# Patient Record
Sex: Female | Born: 1960 | Race: White | Hispanic: No | Smoking: Never smoker
Health system: Southern US, Community
[De-identification: ages and names within clinical notes are randomized; demographics above are authoritative.]

## PROBLEM LIST (undated history)

## (undated) DIAGNOSIS — I10 Essential (primary) hypertension: Secondary | ICD-10-CM

## (undated) DIAGNOSIS — I341 Nonrheumatic mitral (valve) prolapse: Secondary | ICD-10-CM

## (undated) DIAGNOSIS — I480 Paroxysmal atrial fibrillation: Secondary | ICD-10-CM

## (undated) DIAGNOSIS — F419 Anxiety disorder, unspecified: Secondary | ICD-10-CM

## (undated) HISTORY — PX: CHOLECYSTECTOMY: SHX55

## (undated) HISTORY — PX: COLONOSCOPY: SHX174

---

## 2005-03-25 ENCOUNTER — Ambulatory Visit: Payer: Self-pay

## 2011-09-22 ENCOUNTER — Ambulatory Visit: Payer: Self-pay | Admitting: Gastroenterology

## 2013-05-21 ENCOUNTER — Emergency Department: Payer: Self-pay | Admitting: Emergency Medicine

## 2014-12-21 ENCOUNTER — Encounter: Payer: Self-pay | Admitting: Gynecology

## 2014-12-21 ENCOUNTER — Ambulatory Visit
Admission: EM | Admit: 2014-12-21 | Discharge: 2014-12-21 | Disposition: A | Discharge status: Home / Self Care | Payer: 59 | Attending: Family Medicine | Admitting: Family Medicine

## 2014-12-21 DIAGNOSIS — N390 Urinary tract infection, site not specified: Secondary | ICD-10-CM | POA: Insufficient documentation

## 2014-12-21 DIAGNOSIS — I341 Nonrheumatic mitral (valve) prolapse: Secondary | ICD-10-CM | POA: Insufficient documentation

## 2014-12-21 DIAGNOSIS — F419 Anxiety disorder, unspecified: Secondary | ICD-10-CM | POA: Diagnosis not present

## 2014-12-21 DIAGNOSIS — R319 Hematuria, unspecified: Secondary | ICD-10-CM | POA: Diagnosis present

## 2014-12-21 DIAGNOSIS — I10 Essential (primary) hypertension: Secondary | ICD-10-CM | POA: Diagnosis not present

## 2014-12-21 DIAGNOSIS — I480 Paroxysmal atrial fibrillation: Secondary | ICD-10-CM | POA: Insufficient documentation

## 2014-12-21 HISTORY — DX: Anxiety disorder, unspecified: F41.9

## 2014-12-21 HISTORY — DX: Paroxysmal atrial fibrillation: I48.0

## 2014-12-21 HISTORY — DX: Essential (primary) hypertension: I10

## 2014-12-21 HISTORY — DX: Nonrheumatic mitral (valve) prolapse: I34.1

## 2014-12-21 LAB — URINALYSIS COMPLETE WITH MICROSCOPIC (ARMC ONLY)
Glucose, UA: NEGATIVE mg/dL
Nitrite: POSITIVE — AB
Specific Gravity, Urine: 1.025 (ref 1.005–1.030)
Squamous Epithelial / LPF: NONE SEEN — AB
pH: 5.5 (ref 5.0–8.0)

## 2014-12-21 MED ORDER — PHENAZOPYRIDINE HCL 200 MG PO TABS: 200.0000 mg | ORAL_TABLET | Freq: Three times a day (TID) | ORAL | Status: DC

## 2014-12-21 MED ORDER — CEFTRIAXONE SODIUM 1 G IJ SOLR
1.0000 g | Freq: Once | INTRAMUSCULAR | Status: AC
  Administered 2014-12-21: 1 g via INTRAMUSCULAR

## 2014-12-21 MED ORDER — SULFAMETHOXAZOLE-TRIMETHOPRIM 800-160 MG PO TABS: 1.0000 | ORAL_TABLET | Freq: Two times a day (BID) | ORAL | Status: DC

## 2014-12-21 NOTE — ED Provider Notes (Signed)
CSN: [id number]     Arrival date & time 12/21/14  1738 History   None    Chief Complaint  Patient presents with  . Urinary Tract Infection  . Hematuria   (Consider location/radiation/quality/duration/timing/severity/associated sxs/prior Treatment) HPI  A 54 year old female who presents with urinary frequency dysuria hematuria and lower abdominal pain noted today. She states yesterday she felt fine as well as this morning but later this afternoon began to have the urinary symptoms. She has not had fever has felt slightly chilled today. Her last UTI was about 20 years ago. She is going through menopause separated from her husband 2 years ago and is not sexually active at the present time. He has no vaginal discharge. She was frightened to see the blood along with some clots in the toilet. Past Medical History  Diagnosis Date  . Hypertension   . MVP (mitral valve prolapse)   . Anxiety   . PAF (paroxysmal atrial fibrillation)    Past Surgical History  Procedure Laterality Date  . Cholecystectomy     No family history on file. History  Substance Use Topics  . Smoking status: Never Smoker   . Smokeless tobacco: Not on file  . Alcohol Use: Yes   OB History    No data available     Review of Systems  Constitutional: Positive for chills. Negative for fever.  Genitourinary: Positive for dysuria, urgency, frequency and hematuria.  All other systems reviewed and are negative.   Allergies  Review of patient's allergies indicates no known allergies.  Home Medications   Prior to Admission medications   Medication Sig Start Date End Date Taking? Authorizing Provider  ALPRAZolam Prudy Feeler(XANAX) 0.5 MG tablet Take 0.5 mg by mouth at bedtime as needed for anxiety.   Yes Historical Provider, MD  B Complex-C (B-COMPLEX WITH VITAMIN C) tablet Take 1 tablet by mouth daily.   Yes Historical Provider, MD  benazepril-hydrochlorthiazide (LOTENSIN HCT) 20-12.5 MG per tablet Take 1 tablet by mouth daily.    Yes Historical Provider, MD  cholecalciferol (VITAMIN D) 1000 UNITS tablet Take 1,000 Units by mouth daily.   Yes Historical Provider, MD  cyclobenzaprine (FLEXERIL) 10 MG tablet Take 10 mg by mouth 3 (three) times daily as needed for muscle spasms.   Yes Historical Provider, MD  magnesium oxide (MAG-OX) 400 MG tablet Take 400 mg by mouth daily.   Yes Historical Provider, MD  metoprolol (LOPRESSOR) 50 MG tablet Take 50 mg by mouth 2 (two) times daily.   Yes Historical Provider, MD  nabumetone (RELAFEN) 750 MG tablet Take 750 mg by mouth daily.   Yes Historical Provider, MD  sertraline (ZOLOFT) 100 MG tablet Take 100 mg by mouth daily.   Yes Historical Provider, MD  traZODone (DESYREL) 50 MG tablet Take 50 mg by mouth at bedtime.   Yes Historical Provider, MD  phenazopyridine (PYRIDIUM) 200 MG tablet Take 1 tablet (200 mg total) by mouth 3 (three) times daily. 12/21/14   Lutricia FeilWilliam P Gisel Vipond, PA-C  sulfamethoxazole-trimethoprim (BACTRIM DS,SEPTRA DS) 800-160 MG per tablet Take 1 tablet by mouth 2 (two) times daily. 12/21/14   Chrissie NoaWilliam P Ixel Boehning, PA-C   BP 122/82 mmHg  Pulse 80  Temp(Src) 99 F (37.2 C) (Tympanic)  Ht 5\' 5"  (1.651 m)  Wt 214 lb (97.07 kg)  BMI 35.61 kg/m2  SpO2 100% Physical Exam  Constitutional: She is oriented to person, place, and time. She appears well-developed and well-nourished.  HENT:  Head: Normocephalic and atraumatic.  Eyes: EOM are  normal. Pupils are equal, round, and reactive to light.  Pulmonary/Chest: Effort normal and breath sounds normal. No respiratory distress. She has no wheezes. She has no rales. She exhibits no tenderness.  Abdominal: Soft. Bowel sounds are normal. She exhibits no distension and no mass. There is no tenderness. There is no rebound and no guarding.  No CVA tenderness is present  Neurological: She is alert and oriented to person, place, and time.  Skin: Skin is warm and dry.  Psychiatric: She has a normal mood and affect. Her behavior is  normal. Judgment and thought content normal.  Nursing note and vitals reviewed.   ED Course  Procedures (including critical care time) Labs Review Labs Reviewed  URINALYSIS COMPLETEWITH MICROSCOPIC ([facility] ONLY) - Abnormal; Notable for the following:    Color, Urine RED (*)    APPearance TURBID (*)    Bilirubin Urine 2+ (*)    Ketones, ur TRACE (*)    Hgb urine dipstick 3+ (*)    Protein, ur >300 (*)    Nitrite POSITIVE (*)    Leukocytes, UA 2+ (*)    Bacteria, UA FEW (*)    Squamous Epithelial / LPF NONE SEEN (*)    All other components within normal limits  URINE CULTURE   Medications  cefTRIAXone (ROCEPHIN) injection 1 g (1 g Intramuscular Given 12/21/14 1859)   Medications  cefTRIAXone (ROCEPHIN) injection 1 g (1 g Intramuscular Given 12/21/14 1859)   Imaging Review No results found.   MDM   1. UTI (urinary tract infection), uncomplicated    Discharge Medication List as of 12/21/2014  6:48 PM    START taking these medications   Details  phenazopyridine (PYRIDIUM) 200 MG tablet Take 1 tablet (200 mg total) by mouth 3 (three) times daily., Starting 12/21/2014, Until Discontinued, Print    sulfamethoxazole-trimethoprim (BACTRIM DS,SEPTRA DS) 800-160 MG per tablet Take 1 tablet by mouth 2 (two) times daily., Starting 12/21/2014, Until Discontinued, Print       Plan: 1. Test/x-ray results and diagnosis reviewed with patient 2. rx as per orders; risks, benefits, potential side effects reviewed with patient 3. Recommend supportive treatment with fluids,rest 4. F/u prn if symptoms worsen or don't improve Go to ED  For fever or back pain.    Lutricia Feil, PA-C 12/21/14 2019  Lutricia Feil, PA-C 12/21/14 2020

## 2014-12-21 NOTE — ED Notes (Signed)
Patient c/o frequent urination / hematuria / lower back pain and painful urination x today.

## 2014-12-21 NOTE — Discharge Instructions (Signed)

## 2014-12-23 LAB — URINE CULTURE
Culture: >=100000
Special Requests: NORMAL

## 2014-12-28 ENCOUNTER — Telehealth: Payer: Self-pay | Admitting: Emergency Medicine

## 2014-12-28 NOTE — ED Notes (Signed)
Patient was notified that her urine culture grew bacteria and the bacteria is sensitive to Bactrim.   Patient instructed to finish her antibiotic. Patient instructed to follow-up here or with her PCP if symptoms worsen.  Patient verbalized understanding.

## 2015-12-03 ENCOUNTER — Telehealth: Payer: Self-pay

## 2015-12-03 NOTE — Telephone Encounter (Signed)
Patient needs consult appt with Dr. Sherryl MangesSteven Klein for worsening afib  Per Dr.  Bethann PunchesMark Miller [facility].

## 2015-12-06 ENCOUNTER — Encounter: Payer: Self-pay | Admitting: Internal Medicine

## 2015-12-06 ENCOUNTER — Ambulatory Visit (INDEPENDENT_AMBULATORY_CARE_PROVIDER_SITE_OTHER): Payer: 59 | Admitting: Internal Medicine

## 2015-12-06 VITALS — BP 130/86 | HR 73 | Ht 65.0 in | Wt 223.8 lb

## 2015-12-06 DIAGNOSIS — I4891 Unspecified atrial fibrillation: Secondary | ICD-10-CM | POA: Diagnosis not present

## 2015-12-06 MED ORDER — ATENOLOL 50 MG PO TABS: ORAL_TABLET | ORAL | Status: DC

## 2015-12-06 MED ORDER — METOPROLOL TARTRATE 50 MG PO TABS: ORAL_TABLET | ORAL | Status: DC

## 2015-12-06 NOTE — Progress Notes (Signed)
ELECTROPHYSIOLOGY CONSULT NOTE  Patient ID: Amber Beasley, MRN: [id number], DOB/AGE: 07-21-1960 55 y.o. Admit date: (Not on file) Date of Consult: 12/06/2015  Primary Physician: Danella PentonMark F Miller, MD Primary Cardiologist: new  Consulting Physician Hyacinth MeekerMiller  Chief Complaint: atrial fib   HPI Amber Beasley is a 55 y.o. female  Referred for consideration of treatment options for atrial fibrillation.  She long-standing history of palpitations. She describes as irregular and associated with rapid rates. They're accompanied by headaches and chest discomfort and dyspnea on exertion. They apparently have never been recorded as atrial fibrillation.  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 Over the last year or 2 she has gained about 40 pounds in the wake of her husband having left her. This is ongoing least stressful problem.  She is not aware if she snores. She is quite fatigued. She is not sure whether it is related to the aforementioned psychological stress or perhaps her medications or.  She has hypertension. And has had for some time. She does not have diabetes. There is a family history of atrial fibrillation.  In the past she had been treated with metoprolol taken on an as-needed basis for episodes of "atrial fibrillation". These episodes would typically terminate 2-4 hours. More recently she has been taking higher doses of metoprolol tartrate and then even more recently switched to metoprolol succinate. This may be associated with more fatigue.  She has a CHADS-VASc score of 2 if in fact she has atrial fibrillation (gender-1 hypertension-1) she is currently not on anticoagulation     Past Medical History  Diagnosis Date  . Hypertension   . MVP (mitral valve prolapse)   . Anxiety   . PAF (paroxysmal atrial fibrillation) [facility](HCC)       Surgical History:  Past Surgical History  Procedure Laterality Date  . Cholecystectomy       Home Meds: Prior to Admission medications     Medication Sig Start Date End Date Taking? Authorizing Provider  ALPRAZolam Prudy Feeler(XANAX) 0.5 MG tablet Take 0.5 mg by mouth at bedtime as needed for anxiety.   Yes Historical Provider, MD  B Complex-C (B-COMPLEX WITH VITAMIN C) tablet Take 1 tablet by mouth daily.   Yes Historical Provider, MD  benazepril-hydrochlorthiazide (LOTENSIN HCT) 20-12.5 MG tablet Take 0.5 tablets by mouth daily. 12/02/15  Yes Historical Provider, MD  cholecalciferol (VITAMIN D) 1000 UNITS tablet Take 1,000 Units by mouth daily.   Yes Historical Provider, MD  cyclobenzaprine (FLEXERIL) 10 MG tablet Take 10 mg by mouth 3 (three) times daily as needed for muscle spasms.   Yes Historical Provider, MD  magnesium oxide (MAG-OX) 400 MG tablet Take 400 mg by mouth daily.   Yes Historical Provider, MD  metoprolol (LOPRESSOR) 50 MG tablet Take 50 mg by mouth daily as needed. For Afib only 12/02/15  Yes Historical Provider, MD  metoprolol succinate (TOPROL-XL) 50 MG 24 hr tablet Take 50 mg by mouth daily. 11/11/15  Yes Historical Provider, MD  nabumetone (RELAFEN) 750 MG tablet Take 750 mg by mouth daily.   Yes Historical Provider, MD  sertraline (ZOLOFT) 100 MG tablet Take 150 mg by mouth daily.    Yes Historical Provider, MD  traZODone (DESYREL) 50 MG tablet Take 50 mg by mouth at bedtime.   Yes Historical Provider, MD    Allergies:  Allergies  Allergen Reactions  . Zolpidem Other (See Comments)    Depression.     Social History   Social  History  . Marital Status: Married    Spouse Name: N/A  . Number of Children: N/A  . Years of Education: N/A   Occupational History  . Not on file.   Social History Main Topics  . Smoking status: Never Smoker   . Smokeless tobacco: Not on file  . Alcohol Use: Yes  . Drug Use: No  . Sexual Activity: Not on file   Other Topics Concern  . Not on file   Social History Narrative    2  History reviewed. No pertinent family history.   ROS:  Please see the history of present illness.      All other systems reviewed and negative.    Physical Exam   Blood pressure 130/86, pulse 73, height 5\' 5"  (1.651 m), weight 223 lb 12.8 oz (101.515 kg), SpO2 96 %. General: Well developed, well nourished female in no acute distress. Head: Normocephalic, atraumatic, sclera non-icteric, no xanthomas, nares are without discharge. EENT: normal  Lymph Nodes:  none Neck: Negative for carotid bruits. JVD 6-8 Back:without scoliosis kyphosis  Lungs: Clear bilaterally to auscultation without wheezes, rales, or rhonchi. Breathing is unlabored. Heart: RRR with S1 S2. No   murmur . No rubs, or gallops appreciated. Abdomen: Soft, non-tender, non-distended with normoactive bowel sounds. No hepatomegaly. No rebound/guarding. No obvious abdominal masses. Msk:  Strength and tone appear normal for age. Extremities: No clubbing or cyanosis. No* edema.  Distal pedal pulses are 2+ and equal bilaterally. Skin: Warm and Dry Neuro: Alert and oriented X 3. CN III-XII intact Grossly normal sensory and motor function . Psych:  Responds to questions appropriately with a normal affect.      Labs: Cardiac Enzymes No results for input(s): CKTOTAL, CKMB, TROPONINI in the last 72 hours. CBC No results found for: WBC, HGB, HCT, MCV, PLT PROTIME: No results for input(s): LABPROT, INR in the last 72 hours. Chemistry No results for input(s): NA, K, CL, CO2, BUN, CREATININE, CALCIUM, PROT, BILITOT, ALKPHOS, ALT, AST, GLUCOSE in the last 168 hours.  Invalid input(s): LABALBU Lipids No results found for: CHOL, HDL, LDLCALC, TRIG BNP No results found for: PROBNP Thyroid Function Tests: No results for input(s): TSH, T4TOTAL, T3FREE, THYROIDAB in the last 72 hours.  Invalid input(s): FREET3 Miscellaneous No results found for: DDIMER  Radiology/Studies:  No results found.  EKG: sinus 65  16/06/38   Assessment and Plan:  Palpitations  Hypertension  Obesity  Anxiety/depression  Left atrial  enlargement by echo   The patient has a history of palpitations.  They've been presumed to be atrial fibrillation although apparently never documented. This becomes  the first priority. We will use AliveCor monitor.  In the event that it is atrial fibrillation she has a CHADS-VASc score of 2 and anticoagulation would be appropriate. The fact that she has modest left atrial enlargement by her echo a year ago supports the idea of atrial fibrillation although I suspect it occurs in the context of her hypertensive heart disease  She will check with her family as to whether she is snoring. She has progressive fatigue. In the event that she is snoring, recommend a sleep study  I've encouraged her to decrease her alcohol intake     Sherryl MangesSteven Keldrick Pomplun

## 2015-12-06 NOTE — Patient Instructions (Signed)
Medication Instructions: - Your physician has recommended you make the following change in your medication:  ** you have been given prescriptions for 2 different beta blockers to try. You may take them in any order, but DO NOT take more than one at a time. Try to give at least 2 weeks per medication to see what works best for you. Medications are:  - Atenolol 50 mg one tablet by mouth once daily - Metoprolol tartrate 50 mg one tablet by mouth twice daily  Labwork: - none  Procedures/Testing: - none  Follow-Up: - Your physician recommends that you schedule a follow-up appointment in: 4-6 weeks with Dr. Graciela HusbandsKlein   Any Additional Special Instructions Will Be Listed Below (If Applicable). - AliveCor monitor app    If you need a refill on your cardiac medications before your next appointment, please call your pharmacy.

## 2016-01-30 ENCOUNTER — Encounter: Payer: Self-pay | Admitting: Internal Medicine

## 2016-01-30 ENCOUNTER — Ambulatory Visit (INDEPENDENT_AMBULATORY_CARE_PROVIDER_SITE_OTHER): Payer: 59 | Admitting: Internal Medicine

## 2016-01-30 VITALS — BP 110/82 | HR 48 | Ht 65.0 in | Wt 220.8 lb

## 2016-01-30 DIAGNOSIS — R002 Palpitations: Secondary | ICD-10-CM | POA: Diagnosis not present

## 2016-01-30 MED ORDER — APIXABAN 5 MG PO TABS: 5.0000 mg | ORAL_TABLET | Freq: Two times a day (BID) | ORAL | 6 refills | Status: AC

## 2016-01-30 NOTE — Patient Instructions (Addendum)
Medication Instructions: - Your physician has recommended you make the following change in your medication:  1) Start Eliquis 5 mg one tablet by mouth twice daily  Labwork: - none today  Procedures/Testing: - none today  Follow-Up: - pending your decision regarding an A-fib Ablation with Dr. Elberta Fortisamnitz/ Dr. Johney FrameAllred ([facility])  Any Additional Special Instructions Will Be Listed Below (If Applicable).     If you need a refill on your cardiac medications before your next appointment, please call your pharmacy.

## 2016-01-30 NOTE — Progress Notes (Signed)
Patient Care Team: Danella PentonMark F Miller, MD as PCP - General (Internal Medicine)   HPI  Amber Beasley is a 55 y.o. female Seen in follow-up for presumed atrial fibrillation. This has not been documented and so we undertook the use of AliveCor monitor. Thsi confirmed Atrial fibrillation  This patients CHA2DS2-VASc Score and unadjusted Ischemic Stroke Rate (% per year) is equal to 2.2 % stroke rate/year from a score of 2 ( HTN/Age)  Above score calculated as 1 point each if present [CHF, HTN, DM, Vascular=MI/PAD/Aortic Plaque, Age if 65-74, or Female] Above score calculated as 2 points each if present [Age > 75, or Stroke/TIA/TE]  Records and Results Reviewed  [facility]  LV fn normal LA 4.5   Past Medical History:  Diagnosis Date  . Anxiety   . Hypertension   . MVP (mitral valve prolapse)   . PAF (paroxysmal atrial fibrillation) (HCC)     Past Surgical History:  Procedure Laterality Date  . CHOLECYSTECTOMY      Current Outpatient Prescriptions  Medication Sig Dispense Refill  . ALPRAZolam (XANAX) 0.5 MG tablet Take 0.5 mg by mouth at bedtime as needed for anxiety.    . B Complex-C (B-COMPLEX WITH VITAMIN C) tablet Take 1 tablet by mouth daily.    . benazepril-hydrochlorthiazide (LOTENSIN HCT) 20-12.5 MG tablet Take 0.5 tablets by mouth daily.    . cholecalciferol (VITAMIN D) 1000 UNITS tablet Take 1,000 Units by mouth daily.    . cyclobenzaprine (FLEXERIL) 10 MG tablet Take 10 mg by mouth 3 (three) times daily as needed for muscle spasms.    . magnesium oxide (MAG-OX) 400 MG tablet Take 400 mg by mouth daily.    . metoprolol (LOPRESSOR) 50 MG tablet Take one tablet (50 mg) by mouth twice daily 60 tablet 0  . nabumetone (RELAFEN) 750 MG tablet Take 750 mg by mouth daily.    . sertraline (ZOLOFT) 100 MG tablet Take 150 mg by mouth daily.      No current facility-administered medications for this visit.     Allergies  Allergen Reactions  . Zolpidem Other (See Comments)     Depression.       Review of Systems negative except from HPI and PMH  Physical Exam BP 110/82 (BP Location: Left Arm, Patient Position: Sitting, Cuff Size: Normal)   Pulse (!) 48   Ht 5\' 5"  (1.651 m)   Wt 220 lb 12.8 oz (100.2 kg)   BMI 36.74 kg/m  Well developed and well nourished in no acute distress HENT normal E scleral and icterus clear Neck Supple JVP flat; carotids brisk and full Clear to ausculation Regular rate and rhythm, no murmurs gallops or rub Soft with active bowel sounds No clubbing cyanosis  Edema Alert and oriented, grossly normal motor and sensory function Skin Warm and Dry  ALIVECOR strips  Atrial fib with variable ventricular response  Assessment and  Plan  Palpitations  Hypertension  Obesity  Anxiety/depression  Left atrial enlargement by echo  With AFib, we have discussed anticaogulation We discussed the use of the NOACs compared to Coumadin. We briefly reviewed the data of at least comparability in stroke prevention, bleeding and outcome. We discussed some of the new once wherein somewhat associated with decreased ischemic stroke risk, one to be taken daily, and has been shown to be comparable and bleeding risk to aspirin.  We also discussed bleeding associated with warfarin as well as NOACs and a wall bleeding as a  complication of all these drugs intracranial bleeding is more frequently associated with warfarin then the NOACs and a GI bleeding is more commonly associated with the latter  Will begin apixoban   Have recommended primary ablation strategy as opposed to drug therapy as data from [place] would prompt me to allow discontinuation of anticoagulation following successful ablation given her young age  She will check around and then decide as to whether to pursue this in [place] or at one of the local medical centers  Current medicines are reviewed at length with the patient today .  The patient does not have concerns regarding  medicines.

## 2016-03-16 ENCOUNTER — Telehealth: Payer: Self-pay | Admitting: Internal Medicine

## 2016-03-16 NOTE — Telephone Encounter (Signed)
Pt calling stating she would like to go ahead and schedule her ablation  Please call

## 2016-03-16 NOTE — Telephone Encounter (Signed)
Pt ready to schedule ablation. Forward to Sherri RadHeather McGhee, RN

## 2016-03-17 NOTE — Telephone Encounter (Signed)
I left a message for the patient to call. Will need to see Dr. Elberta Fortisamnitz/ Dr. Johney FrameAllred for an office visit prior.

## 2016-03-19 NOTE — Telephone Encounter (Signed)
I left a message for the patient to call. 

## 2016-03-30 ENCOUNTER — Other Ambulatory Visit: Payer: Self-pay

## 2016-03-30 MED ORDER — METOPROLOL TARTRATE 50 MG PO TABS
ORAL_TABLET | ORAL | 3 refills | Status: DC
Start: 2016-03-30 — End: 2016-08-04

## 2016-04-14 ENCOUNTER — Telehealth: Payer: Self-pay | Admitting: Internal Medicine

## 2016-04-14 NOTE — Telephone Encounter (Signed)
I spoke with pt. She is calling to schedule appt with Dr. Elberta Fortisamnitz or Dr. Johney FrameAllred.  Appt made for pt to see Dr. Elberta Fortisamnitz on 04/22/16 at 9:45

## 2016-04-14 NOTE — Telephone Encounter (Signed)
New Message  Pt call requesting to speak with RN. Pt states she is returning RN call. Please call back to discuss

## 2016-04-22 ENCOUNTER — Encounter: Payer: Self-pay | Admitting: Cardiology

## 2016-04-22 ENCOUNTER — Ambulatory Visit (INDEPENDENT_AMBULATORY_CARE_PROVIDER_SITE_OTHER): Payer: 59 | Admitting: Cardiology

## 2016-04-22 ENCOUNTER — Encounter (INDEPENDENT_AMBULATORY_CARE_PROVIDER_SITE_OTHER): Payer: Self-pay

## 2016-04-22 VITALS — BP 122/78 | HR 52 | Ht 65.0 in | Wt 222.0 lb

## 2016-04-22 DIAGNOSIS — I480 Paroxysmal atrial fibrillation: Secondary | ICD-10-CM | POA: Diagnosis not present

## 2016-04-22 MED ORDER — FLECAINIDE ACETATE 50 MG PO TABS: 75.0000 mg | ORAL_TABLET | Freq: Two times a day (BID) | ORAL | 3 refills | Status: DC

## 2016-04-22 NOTE — Progress Notes (Signed)
Electrophysiology Office Note   Date:  04/22/2016   ID:  Amber Beasley, DOB [birth year], MRN [id number]  PCP:  Danella PentonMark F Miller, MD  Primary Electrophysiologist: Amber DurandKlein Morghan Kester Martin Domitila Stetler, MD    Chief Complaint  Patient presents with  . Follow-up    PAF/discuss ablation     History of Present Illness: Amber Beasley is a 55 y.o. female who presents today for electrophysiology evaluation.   Hx anxieth, HTN, and paroxysmal atrial fibrillation. Wore AliveCor monitor which confirmed her AF. She has had longstanding palpitations associated with irregular, rapid rates. She says that she has been going through a difficult divorce over the last 4 years and has gained approximately 40 pounds in that time. She says that she has at her heaviest currently. She works as an Mudloggeraerobics instructor and has been continuing to work even though she has had atrial fibrillation at times. She says that her main symptoms are palpitations, chest pain, neck pain, fatigue, and shortness of breath. She initially was having symptoms once every few weeks, but they are happening 2-3 times per week at this point. Initially metoprolol was helping to control her symptoms, but she has had further episodes that have lasted up to 24 hours.   Today, she denies symptoms of palpitations, chest pain, shortness of breath, orthopnea, PND, lower extremity edema, claudication, dizziness, presyncope, syncope, bleeding, or neurologic sequela. The patient is tolerating medications without difficulties and is otherwise without complaint today.    Past Medical History:  Diagnosis Date  . Anxiety   . Hypertension   . MVP (mitral valve prolapse)   . PAF (paroxysmal atrial fibrillation) (HCC)    Past Surgical History:  Procedure Laterality Date  . CHOLECYSTECTOMY       Current Outpatient Prescriptions  Medication Sig Dispense Refill  . ALPRAZolam (XANAX) 0.5 MG tablet Take 0.5 mg by mouth at bedtime as needed for anxiety.    Marland Kitchen.  apixaban (ELIQUIS) 5 MG TABS tablet Take 1 tablet (5 mg total) by mouth 2 (two) times daily. 60 tablet 6  . B Complex-C (B-COMPLEX WITH VITAMIN C) tablet Take 1 tablet by mouth daily.    . benazepril-hydrochlorthiazide (LOTENSIN HCT) 20-12.5 MG tablet Take 0.5 tablets by mouth daily.    . cholecalciferol (VITAMIN D) 1000 UNITS tablet Take 1,000 Units by mouth daily.    . cyclobenzaprine (FLEXERIL) 10 MG tablet Take 10 mg by mouth 3 (three) times daily as needed for muscle spasms.    . magnesium oxide (MAG-OX) 400 MG tablet Take 400 mg by mouth daily.    . metoprolol (LOPRESSOR) 50 MG tablet Take one tablet (50 mg) by mouth twice daily 60 tablet 3  . sertraline (ZOLOFT) 100 MG tablet Take 150 mg by mouth daily.      No current facility-administered medications for this visit.     Allergies:   Zolpidem   Social History:  The patient  reports that she has never smoked. She has never used smokeless tobacco. She reports that she drinks alcohol. She reports that she does not use drugs.   Family History:  The patient's family history includes Atrial fibrillation in her mother; Bipolar disorder in her paternal grandfather; Breast cancer in her maternal grandmother; Cancer in her mother; Heart attack in her maternal grandmother and paternal grandmother; Hypertension in her brother, mother, and paternal grandmother; Lung cancer in her father; Stroke in her paternal grandfather.    ROS:  Please see the history of present illness.  Otherwise, review of systems is positive for none.   All other systems are reviewed and negative.    PHYSICAL EXAM: VS:  BP 122/78   Pulse (!) 52   Ht 5\' 5"  (1.651 m)   Wt 222 lb (100.7 kg)   BMI 36.94 kg/m  , BMI Body mass index is 36.94 kg/m. GEN: Well nourished, well developed, in no acute distress  HEENT: normal  Neck: no JVD, carotid bruits, or masses Cardiac: RRR; no murmurs, rubs, or gallops,no edema  Respiratory:  clear to auscultation bilaterally, normal  work of breathing GI: soft, nontender, nondistended, + BS MS: no deformity or atrophy  Skin: warm and dry Neuro:  Strength and sensation are intact Psych: euthymic mood, full affect  EKG:  EKG is ordered today. Personal review of the ekg ordered shows sinus rhythm, rate 52  Recent Labs: No results found for requested labs within last 8760 hours.    Lipid Panel  No results found for: CHOL, TRIG, HDL, CHOLHDL, VLDL, LDLCALC, LDLDIRECT   Wt Readings from Last 3 Encounters:  04/22/16 222 lb (100.7 kg)  01/30/16 220 lb 12.8 oz (100.2 kg)  12/06/15 223 lb 12.8 oz (101.5 kg)      Other studies Reviewed: Additional studies/ records that were reviewed today include: TTE  5//16 (care everywhere) Review of the above records today demonstrates:  LVEF >55%, LA mildly enlarged   ASSESSMENT AND PLAN:  1.  Paroxysmal atrial fibrillation: on Eliquis. I discussed with her options of therapy including medications versus ablation. Risks and benefits of ablation were discussed. Risks include bleeding, tamponade, heart block, stroke, and damage to surrounding organs among others. I also discussed with her the option of flecainide therapy. She says that she is working out her insurance currently and therefore would prefer medical management at this time. We Amber Beasley put her on flecainide 75 mg twice a day with stress testing in a week to 10 days. I Amber Beasley see her back in 3 months, and we Amber Beasley further discuss ablation. In the interim, she says that she is planning to change her diet and eat healthier and hopefully lose some weight which may help control her atrial fibrillation.  This patients CHA2DS2-VASc Score and unadjusted Ischemic Stroke Rate (% per year) is equal to 2.2 % stroke rate/year from a score of 2  Above score calculated as 1 point each if present [CHF, HTN, DM, Vascular=MI/PAD/Aortic Plaque, Age if 65-74, or Female] Above score calculated as 2 points each if present [Age > 75, or  Stroke/TIA/TE]   2. Hypertension: well controlled currently    Current medicines are reviewed at length with the patient today.   The patient does not have concerns regarding her medicines.  The following changes were made today:  flecainide  Labs/ tests ordered today include:  No orders of the defined types were placed in this encounter.    Disposition:   FU with Toniann Dickerson 3 months  Signed, Calla Wedekind Jorja LoaMartin Lailani Tool, MD  04/22/2016 10:25 AM     [facility] [address] [place] [address] [phone] (office) [phone] (fax)

## 2016-04-22 NOTE — Patient Instructions (Signed)
Medication Instructions:  Your physician has recommended you make the following change in your medication:  Start flecainide 75 mg by mouth twice daily   Labwork: none  Testing/Procedures: Your physician has requested that you have an exercise tolerance test. For further information please visit [email or web address]. Please also follow instruction sheet, as given. To be done in 10 days.     Follow-Up: Your physician recommends that you schedule a follow-up appointment in: 3 months with Dr. Elberta Fortisamnitz    Any Other Special Instructions Will Be Listed Below (If Applicable).     If you need a refill on your cardiac medications before your next appointment, please call your pharmacy.

## 2016-04-24 ENCOUNTER — Encounter: Payer: Self-pay | Admitting: Cardiology

## 2016-05-05 ENCOUNTER — Ambulatory Visit: Payer: 59

## 2016-05-15 ENCOUNTER — Ambulatory Visit (INDEPENDENT_AMBULATORY_CARE_PROVIDER_SITE_OTHER): Payer: 59

## 2016-05-15 DIAGNOSIS — I480 Paroxysmal atrial fibrillation: Secondary | ICD-10-CM

## 2016-05-15 LAB — EXERCISE TOLERANCE TEST
CSEPEDS: 0 s
CSEPEW: 10.1 METS
Exercise duration (min): 8 min
MPHR: 165 {beats}/min
Peak HR: 122 {beats}/min
Percent HR: 73 %
RPE: 17
Rest HR: 46 {beats}/min

## 2016-05-27 ENCOUNTER — Telehealth: Payer: Self-pay | Admitting: *Deleted

## 2016-05-27 NOTE — Telephone Encounter (Signed)
-----   Message from Will Jorja LoaMartin Camnitz, MD sent at 05/21/2016 12:48 PM EST ----- No major abnormalities seen on stress testing.

## 2016-05-27 NOTE — Telephone Encounter (Signed)
Patient informed. 

## 2016-05-27 NOTE — Telephone Encounter (Signed)
Follow up ° ° ° ° ° °Returning a call to the nurse °

## 2016-07-23 ENCOUNTER — Ambulatory Visit: Payer: 59 | Admitting: Cardiology

## 2016-07-24 ENCOUNTER — Ambulatory Visit: Payer: 59 | Admitting: Cardiology

## 2016-08-04 ENCOUNTER — Ambulatory Visit (INDEPENDENT_AMBULATORY_CARE_PROVIDER_SITE_OTHER): Payer: BLUE CROSS/BLUE SHIELD | Admitting: Cardiology

## 2016-08-04 ENCOUNTER — Encounter: Payer: Self-pay | Admitting: Cardiology

## 2016-08-04 ENCOUNTER — Encounter (INDEPENDENT_AMBULATORY_CARE_PROVIDER_SITE_OTHER): Payer: Self-pay

## 2016-08-04 VITALS — BP 120/80 | HR 64 | Ht 64.5 in | Wt 227.0 lb

## 2016-08-04 DIAGNOSIS — I10 Essential (primary) hypertension: Secondary | ICD-10-CM | POA: Diagnosis not present

## 2016-08-04 DIAGNOSIS — I480 Paroxysmal atrial fibrillation: Secondary | ICD-10-CM | POA: Insufficient documentation

## 2016-08-04 NOTE — Progress Notes (Signed)
Electrophysiology Office Note   Date:  08/04/2016   ID:  Amber Morrowanya R Gardiner, DOB 1960-11-21, MRN [id number]  PCP:  Danella PentonMark F Miller, MD  Primary Electrophysiologist: Doran DurandKlein Kester Stimpson Martin Renell Coaxum, MD    Chief Complaint  Patient presents with  . Follow-up    PAF     History of Present Illness: Amber Beasley is a 56 y.o. female who presents today for electrophysiology evaluation.   Hx anxieth, HTN, and paroxysmal atrial fibrillation. Wore AliveCor monitor which confirmed her AF. She has had longstanding palpitations associated with irregular, rapid rates. She was put on flecainide at her last visit. She says that her episodes of atrial fibrillation are significantly reduced. She is having 1-2 episodes per month that lasts only about 30 minutes. She has also had more prolonged episodes but those are on days that she forgets to take her medications. She is decreased flecainide dose from 75 to 50 mg as she is having trouble cutting the pills.   Today, she denies symptoms of palpitations, chest pain, shortness of breath, orthopnea, PND, lower extremity edema, claudication, dizziness, presyncope, syncope, bleeding, or neurologic sequela. The patient is tolerating medications without difficulties and is otherwise without complaint today.    Past Medical History:  Diagnosis Date  . Anxiety   . Hypertension   . MVP (mitral valve prolapse)   . PAF (paroxysmal atrial fibrillation) (HCC)    Past Surgical History:  Procedure Laterality Date  . CHOLECYSTECTOMY       Current Outpatient Prescriptions  Medication Sig Dispense Refill  . ALPRAZolam (XANAX) 0.5 MG tablet Take 0.5 mg by mouth at bedtime as needed for anxiety.    Marland Kitchen. apixaban (ELIQUIS) 5 MG TABS tablet Take 1 tablet (5 mg total) by mouth 2 (two) times daily. 60 tablet 6  . B Complex-C (B-COMPLEX WITH VITAMIN C) tablet Take 1 tablet by mouth daily.    . benazepril-hydrochlorthiazide (LOTENSIN HCT) 20-12.5 MG tablet Take 0.5 tablets by mouth  daily.    . cholecalciferol (VITAMIN D) 1000 UNITS tablet Take 1,000 Units by mouth daily.    . cyclobenzaprine (FLEXERIL) 10 MG tablet Take 10 mg by mouth 3 (three) times daily as needed for muscle spasms.    . flecainide (TAMBOCOR) 50 MG tablet Take 50 mg by mouth 2 (two) times daily.    . magnesium oxide (MAG-OX) 400 MG tablet Take 400 mg by mouth daily.    . metoprolol (LOPRESSOR) 50 MG tablet Take 25 mg by mouth 2 (two) times daily.    . sertraline (ZOLOFT) 100 MG tablet Take 150 mg by mouth daily.      No current facility-administered medications for this visit.     Allergies:   Zolpidem   Social History:  The patient  reports that she has never smoked. She has never used smokeless tobacco. She reports that she drinks alcohol. She reports that she does not use drugs.   Family History:  The patient's family history includes Atrial fibrillation in her mother; Bipolar disorder in her paternal grandfather; Breast cancer in her maternal grandmother; Cancer in her mother; Heart attack in her maternal grandmother and paternal grandmother; Hypertension in her brother, mother, and paternal grandmother; Lung cancer in her father; Stroke in her paternal grandfather.    ROS:  Please see the history of present illness.   Otherwise, review of systems is positive for none.   All other systems are reviewed and negative.    PHYSICAL EXAM: VS:  BP 120/80  Pulse 64   Ht 5' 4.5" (1.638 m)   Wt 227 lb (103 kg)   BMI 38.36 kg/m  , BMI Body mass index is 38.36 kg/m. GEN: Well nourished, well developed, in no acute distress  HEENT: normal  Neck: no JVD, carotid bruits, or masses Cardiac: RRR; no murmurs, rubs, or gallops,no edema  Respiratory:  clear to auscultation bilaterally, normal work of breathing GI: soft, nontender, nondistended, + BS MS: no deformity or atrophy  Skin: warm and dry Neuro:  Strength and sensation are intact Psych: euthymic mood, full affect  EKG:  EKG is ordered  today. Personal review of the ekg ordered shows sinus rhythm, rate 64  Recent Labs: No results found for requested labs within last 8760 hours.    Lipid Panel  No results found for: CHOL, TRIG, HDL, CHOLHDL, VLDL, LDLCALC, LDLDIRECT   Wt Readings from Last 3 Encounters:  08/04/16 227 lb (103 kg)  04/22/16 222 lb (100.7 kg)  01/30/16 220 lb 12.8 oz (100.2 kg)      Other studies Reviewed: Additional studies/ records that were reviewed today include: TTE  5//16 (care everywhere) Review of the above records today demonstrates:  LVEF >55%, LA mildly enlarged   ASSESSMENT AND PLAN:  1.  Paroxysmal atrial fibrillation: on Eliquis. Feeling much better on her dose of flecainide 50 mg. Having much fewer episodes of palpitations. We'll continue her on the current dose. I did tell her that if she has continued symptoms from atrial fibrillation, that we could either increase the medicine or discuss ablation.  This patients CHA2DS2-VASc Score and unadjusted Ischemic Stroke Rate (% per year) is equal to 2.2 % stroke rate/year from a score of 2  Above score calculated as 1 point each if present [CHF, HTN, DM, Vascular=MI/PAD/Aortic Plaque, Age if 65-74, or Female] Above score calculated as 2 points each if present [Age > 75, or Stroke/TIA/TE]   2. Hypertension: well controlled currently    Current medicines are reviewed at length with the patient today.   The patient does not have concerns regarding her medicines.  The following changes were made today:  flecainide  Labs/ tests ordered today include:  Orders Placed This Encounter  Procedures  . EKG 12-Lead     Disposition:   FU with Hiliana Eilts 3 months  Signed, Zaryiah Barz Jorja Loa, MD  08/04/2016 3:11 PM     [facility] [address] [place] [address] [phone] (office) [phone] (fax)

## 2016-08-04 NOTE — Patient Instructions (Signed)
Medication Instructions:  Your physician recommends that you continue on your current medications as directed. Please refer to the Current Medication list given to you today.  If you need a refill on your cardiac medications before your next appointment, please call your pharmacy.   Labwork: None ordered  Testing/Procedures: None ordered  Follow-Up: Your physician wants you to follow-up in: 6 months with Dr. Camnitz.  You will receive a reminder letter in the mail two months in advance. If you don't receive a letter, please call our office to schedule the follow-up appointment.  Thank you for choosing [facility]!!   Antonetta Clanton, RN [phone]         

## 2016-09-09 ENCOUNTER — Other Ambulatory Visit: Payer: Self-pay | Admitting: Internal Medicine

## 2016-10-31 ENCOUNTER — Other Ambulatory Visit: Payer: Self-pay | Admitting: Cardiology

## 2016-10-31 DIAGNOSIS — I480 Paroxysmal atrial fibrillation: Secondary | ICD-10-CM

## 2017-02-04 ENCOUNTER — Other Ambulatory Visit: Payer: Self-pay | Admitting: Cardiology

## 2017-02-04 ENCOUNTER — Other Ambulatory Visit: Payer: Self-pay | Admitting: *Deleted

## 2017-02-04 NOTE — Telephone Encounter (Signed)
Pt last saw Dr Elberta Fortisamnitz on 08/04/16, last labs in care everywhere 11/25/15 Creat 1.0, overdue for repeat labwork.  Called pt's primary MD Dr Bethann PunchesMark Miller, they do not have more recent labwork, had pt scheduled for repeat labs but she did not keep appointment.  Attempted to call pt to schedule appt for labwork.  LMOM TCB for appt for BMP and CBC follow-up Eliquis labwork.  Age 56, weight 103kg, based on specified criteria pt is on appropriate dosage of Eliquis 5mg  BID.  Will refill rx once labwork repeated and up to date. Will await call back from pt to schedule labwork.

## 2017-02-05 ENCOUNTER — Other Ambulatory Visit: Payer: Self-pay | Admitting: *Deleted

## 2017-02-05 NOTE — Telephone Encounter (Signed)
Called spoke with pt advised overdue for follow-up CBC and BMP for Eliquis, advised we needed to repeat this labwork to make sure pt was on appropriate dosage of Eliquis.  Pt stated she was driving in the car and did not have access to her calendar, but she would call us back on Tuesday 02/09/17 to schedule lab appt.  Pt states she has plenty of Eliquis at present does not need rx sent in today.  Will await pt call back on Tuesday 02/09/17.

## 2017-07-12 ENCOUNTER — Other Ambulatory Visit: Payer: Self-pay | Admitting: Cardiology

## 2017-07-12 MED ORDER — METOPROLOL TARTRATE 50 MG PO TABS: ORAL_TABLET | ORAL | 0 refills | Status: DC

## 2017-08-16 ENCOUNTER — Ambulatory Visit: Payer: BLUE CROSS/BLUE SHIELD | Admitting: Certified Registered Nurse Anesthetist

## 2017-08-16 ENCOUNTER — Encounter: Payer: Self-pay | Admitting: Certified Registered Nurse Anesthetist

## 2017-08-16 ENCOUNTER — Ambulatory Visit
Admission: RE | Admit: 2017-08-16 | Discharge: 2017-08-16 | Disposition: A | Discharge status: Home / Self Care | Payer: BLUE CROSS/BLUE SHIELD | Source: Ambulatory Visit | Attending: Gastroenterology | Admitting: Gastroenterology

## 2017-08-16 ENCOUNTER — Encounter
Admission: RE | Disposition: A | Discharge status: Home / Self Care | Payer: Self-pay | Source: Ambulatory Visit | Attending: Gastroenterology

## 2017-08-16 DIAGNOSIS — K5730 Diverticulosis of large intestine without perforation or abscess without bleeding: Secondary | ICD-10-CM | POA: Diagnosis not present

## 2017-08-16 DIAGNOSIS — Z79899 Other long term (current) drug therapy: Secondary | ICD-10-CM | POA: Diagnosis not present

## 2017-08-16 DIAGNOSIS — Z1211 Encounter for screening for malignant neoplasm of colon: Secondary | ICD-10-CM | POA: Diagnosis present

## 2017-08-16 DIAGNOSIS — F419 Anxiety disorder, unspecified: Secondary | ICD-10-CM | POA: Insufficient documentation

## 2017-08-16 DIAGNOSIS — I480 Paroxysmal atrial fibrillation: Secondary | ICD-10-CM | POA: Insufficient documentation

## 2017-08-16 DIAGNOSIS — K635 Polyp of colon: Secondary | ICD-10-CM | POA: Insufficient documentation

## 2017-08-16 DIAGNOSIS — I341 Nonrheumatic mitral (valve) prolapse: Secondary | ICD-10-CM | POA: Diagnosis not present

## 2017-08-16 DIAGNOSIS — Z7901 Long term (current) use of anticoagulants: Secondary | ICD-10-CM | POA: Diagnosis not present

## 2017-08-16 DIAGNOSIS — K640 First degree hemorrhoids: Secondary | ICD-10-CM | POA: Insufficient documentation

## 2017-08-16 DIAGNOSIS — Z8371 Family history of colonic polyps: Secondary | ICD-10-CM | POA: Insufficient documentation

## 2017-08-16 DIAGNOSIS — I10 Essential (primary) hypertension: Secondary | ICD-10-CM | POA: Diagnosis not present

## 2017-08-16 HISTORY — PX: COLONOSCOPY WITH PROPOFOL: SHX5780

## 2017-08-16 SURGERY — COLONOSCOPY WITH PROPOFOL
Anesthesia: General

## 2017-08-16 MED ORDER — SODIUM CHLORIDE 0.9 % IV SOLN
INTRAVENOUS | Status: DC
  Administered 2017-08-16: 12:00:00 via INTRAVENOUS

## 2017-08-16 MED ORDER — PROPOFOL 10 MG/ML IV BOLUS
INTRAVENOUS | Status: DC | PRN
  Administered 2017-08-16: 100 mg via INTRAVENOUS

## 2017-08-16 MED ORDER — LIDOCAINE HCL (PF) 2 % IJ SOLN
INTRAMUSCULAR | Status: AC
  Filled 2017-08-16: qty 10

## 2017-08-16 MED ORDER — SODIUM CHLORIDE 0.9 % IV SOLN
INTRAVENOUS | Status: DC
  Administered 2017-08-16: 1000 mL via INTRAVENOUS

## 2017-08-16 MED ORDER — LIDOCAINE HCL (CARDIAC) 20 MG/ML IV SOLN
INTRAVENOUS | Status: DC | PRN
  Administered 2017-08-16: 50 mg via INTRAVENOUS

## 2017-08-16 MED ORDER — PROPOFOL 500 MG/50ML IV EMUL
INTRAVENOUS | Status: DC | PRN
  Administered 2017-08-16: 150 ug/kg/min via INTRAVENOUS

## 2017-08-16 MED ORDER — LIDOCAINE HCL (PF) 1 % IJ SOLN
2.0000 mL | Freq: Once | INTRAMUSCULAR | Status: AC
  Administered 2017-08-16: 0.3 mL via INTRADERMAL

## 2017-08-16 MED ORDER — PROPOFOL 10 MG/ML IV BOLUS
INTRAVENOUS | Status: AC
  Filled 2017-08-16: qty 20

## 2017-08-16 MED ORDER — LIDOCAINE HCL (PF) 1 % IJ SOLN
INTRAMUSCULAR | Status: AC
  Administered 2017-08-16: 0.3 mL via INTRADERMAL
  Filled 2017-08-16: qty 2

## 2017-08-16 NOTE — Anesthesia Post-op Follow-up Note (Signed)
Anesthesia QCDR form completed.        

## 2017-08-16 NOTE — Transfer of Care (Signed)
Immediate Anesthesia Transfer of Care Note  Patient: Amber Beasley  Procedure(s) Performed: COLONOSCOPY WITH PROPOFOL (N/A )  Patient Location: PACU and Endoscopy Unit  Anesthesia Type:General  Level of Consciousness: awake  Airway & Oxygen Therapy: Patient Spontanous Breathing  Post-op Assessment: Report given to RN  Post vital signs: stable  Last Vitals:  Vitals:   08/16/17 1147 08/16/17 1250  BP: 130/88   Pulse: (!) 51 (!) (P) 53  Resp: 17 (P) 16  Temp: (!) 36.4 C (P) 36.4 C  SpO2: 98%     Last Pain:  Vitals:   08/16/17 1250  TempSrc: (P) Tympanic         Complications: No apparent anesthesia complications

## 2017-08-16 NOTE — Anesthesia Preprocedure Evaluation (Signed)
Anesthesia Evaluation  Patient identified by MRN, date of birth, ID band Patient awake    Reviewed: Allergy & Precautions, NPO status , Patient's Chart, lab work & pertinent test results, reviewed documented beta blocker date and time   Airway Mallampati: III  TM Distance: <3 FB     Dental   Pulmonary neg pulmonary ROS,    Pulmonary exam normal        Cardiovascular hypertension, Pt. on medications and Pt. on home beta blockers Normal cardiovascular exam+ dysrhythmias Atrial Fibrillation + Valvular Problems/Murmurs MVP      Neuro/Psych Anxiety negative neurological ROS     GI/Hepatic negative GI ROS, Neg liver ROS,   Endo/Other  negative endocrine ROS  Renal/GU negative Renal ROS  negative genitourinary   Musculoskeletal negative musculoskeletal ROS (+)   Abdominal Normal abdominal exam  (+)   Peds negative pediatric ROS (+)  Hematology negative hematology ROS (+)   Anesthesia Other Findings   Reproductive/Obstetrics                             Anesthesia Physical Anesthesia Plan  ASA: III  Anesthesia Plan: General   Post-op Pain Management:    Induction: Intravenous  PONV Risk Score and Plan:   Airway Management Planned: Nasal Cannula  Additional Equipment:   Intra-op Plan:   Post-operative Plan:   Informed Consent: I have reviewed the patients History and Physical, chart, labs and discussed the procedure including the risks, benefits and alternatives for the proposed anesthesia with the patient or authorized representative who has indicated his/her understanding and acceptance.   Dental advisory given  Plan Discussed with: CRNA and Surgeon  Anesthesia Plan Comments:         Anesthesia Quick Evaluation

## 2017-08-16 NOTE — Anesthesia Postprocedure Evaluation (Signed)
Anesthesia Post Note  Patient: Amber Beasley  Procedure(s) Performed: COLONOSCOPY WITH PROPOFOL (N/A )  Patient location during evaluation: PACU Anesthesia Type: General Level of consciousness: awake and alert and oriented Pain management: pain level controlled Vital Signs Assessment: post-procedure vital signs reviewed and stable Respiratory status: spontaneous breathing Cardiovascular status: blood pressure returned to baseline Anesthetic complications: no     Last Vitals:  Vitals:   08/16/17 1300 08/16/17 1310  BP: 109/81 (!) 127/93  Pulse: (!) 44 (!) 47  Resp: 16 17  Temp:    SpO2: 99% 100%    Last Pain:  Vitals:   08/16/17 1250  TempSrc: Tympanic                 Eveleen Mcnear

## 2017-08-16 NOTE — Op Note (Signed)
[facility] Gastroenterology Patient Name: Amber Beasley Procedure Date: 08/16/2017 11:33 AM MRN: [id number] Account #: [id number] Date of Birth: [birth year] Admit Type: Outpatient Age: 57 [facility] ROOM 1 Gender: Female Note Status: Finalized Procedure:            Colonoscopy Indications:          Family history of colonic polyps in a first-degree                        relative Providers:            Christena DeemMartin U. Mena Lienau, MD Referring MD:         Danella PentonMark F. Miller, MD (Referring MD) Medicines:            Monitored Anesthesia Care Complications:        No immediate complications. Procedure:            Pre-Anesthesia Assessment:                       - ASA Grade Assessment: III - A patient with severe                        systemic disease.                       After obtaining informed consent, the colonoscope was                        passed under direct vision. Throughout the procedure,                        the patient's blood pressure, pulse, and oxygen                        saturations were monitored continuously. The                        Colonoscope was introduced through the anus and                        advanced to the the cecum, identified by appendiceal                        orifice and ileocecal valve. The colonoscopy was                        performed without difficulty. The patient tolerated the                        procedure well. The quality of the bowel preparation                        was good. Findings:      A few small-mouthed diverticula were found in the sigmoid colon and       distal descending colon.      Two sessile polyps were found in the cecum. The polyps were less than 1       mm in size. These polyps were removed with a cold biopsy forceps.       Resection and retrieval were complete.      Two sessile polyps were found  in the distal sigmoid colon. The polyps       were 1 to 2 mm in size. These polyps were removed with a  cold biopsy       forceps. Resection and retrieval were complete.      Biopsies for histology were taken with a cold forceps from the right       colon and left colon for evaluation of microscopic colitis.      The digital rectal exam was normal.      Non-bleeding internal hemorrhoids were found during retroflexion. The       hemorrhoids were small and Grade I (internal hemorrhoids that do not       prolapse). Impression:           - Diverticulosis in the sigmoid colon and in the distal                        descending colon.                       - Two less than 1 mm polyps in the cecum, removed with                        a cold biopsy forceps. Resected and retrieved.                       - Two 1 to 2 mm polyps in the distal sigmoid colon,                        removed with a cold biopsy forceps. Resected and                        retrieved.                       - Non-bleeding internal hemorrhoids.                       - Biopsies were taken with a cold forceps from the                        right colon and left colon for evaluation of                        microscopic colitis. Recommendation:       - Await pathology results.                       - Telephone GI clinic for pathology results in 1 week. Procedure Code(s):    --- Professional ---                       [phone], Colonoscopy, flexible; with biopsy, single or                        multiple Diagnosis Code(s):    --- Professional ---                       K64.0, First degree hemorrhoids                       D12.0, Benign neoplasm of cecum  D12.5, Benign neoplasm of sigmoid colon                       Z83.71, Family history of colonic polyps                       K57.30, Diverticulosis of large intestine without                        perforation or abscess without bleeding CPT copyright 2016 [employer]. All rights reserved. The codes documented in this report are preliminary and  upon coder review may  be revised to meet current compliance requirements. Christena Deem, MD 08/16/2017 12:47:23 PM This report has been signed electronically. Number of Addenda: 0 Note Initiated On: 08/16/2017 11:33 AM Scope Withdrawal Time: 0 hours 11 minutes 46 seconds  Total Procedure Duration: 0 hours 16 minutes 32 seconds       [facility]

## 2017-08-16 NOTE — H&P (Signed)
Outpatient short stay form Pre-procedure 08/16/2017 12:10 PM Christena DeemMartin U Skulskie MD  Primary Physician: Dr. Bethann PunchesMark Miller  Reason for visit: Colonoscopy  History of present illness: Patient is a 57 year old female presenting today as above.  She there is a family history of colon polyps in a primary relative.  The patient's last colonoscopy was about 5 years ago with hyperplastic polyps.  His have a history of atrial fibrillation for which she takes apixaban.  Is held that for about 40 at least 48 hours.  Takes no other blood thinning agents or aspirin product.    Current Facility-Administered Medications:  .  0.9 %  sodium chloride infusion, , Intravenous, Continuous, Christena DeemSkulskie, Martin U, MD .  0.9 %  sodium chloride infusion, , Intravenous, Continuous, Christena DeemSkulskie, Martin U, MD .  lidocaine (PF) (XYLOCAINE) 1 % injection 2 mL, 2 mL, Intradermal, Once, Christena DeemSkulskie, Martin U, MD .  lidocaine (PF) (XYLOCAINE) 1 % injection, , , ,   Medications Prior to Admission  Medication Sig Dispense Refill Last Dose  . ALPRAZolam (XANAX) 0.5 MG tablet Take 0.5 mg by mouth at bedtime as needed for anxiety.   Taking  . apixaban (ELIQUIS) 5 MG TABS tablet Take 1 tablet (5 mg total) by mouth 2 (two) times daily. 60 tablet 6 08/12/2017  . B Complex-C (B-COMPLEX WITH VITAMIN C) tablet Take 1 tablet by mouth daily.   Taking  . benazepril-hydrochlorthiazide (LOTENSIN HCT) 20-12.5 MG tablet Take 0.5 tablets by mouth daily.   Taking  . cholecalciferol (VITAMIN D) 1000 UNITS tablet Take 1,000 Units by mouth daily.   Taking  . cyclobenzaprine (FLEXERIL) 10 MG tablet Take 10 mg by mouth 3 (three) times daily as needed for muscle spasms.   Taking  . flecainide (TAMBOCOR) 50 MG tablet Take 1 tablet (50 mg total) by mouth 2 (two) times daily. 180 tablet 2 08/16/2017 at 0600  . magnesium oxide (MAG-OX) 400 MG tablet Take 400 mg by mouth daily.   Taking  . metoprolol tartrate (LOPRESSOR) 50 MG tablet TAKE ONE TABLET (50 MG) BY MOUTH  TWICE DAILY. Please make yearly appt with Dr. Elberta Fortisamnitz for February. 1st attempt. 60 tablet 0 08/16/2017 at 0600  . sertraline (ZOLOFT) 100 MG tablet Take 150 mg by mouth daily.    Taking     Allergies  Allergen Reactions  . Zolpidem Other (See Comments)    Depression.      Past Medical History:  Diagnosis Date  . Anxiety   . Hypertension   . MVP (mitral valve prolapse)   . PAF (paroxysmal atrial fibrillation) (HCC)     Review of systems:      Physical Exam    Heart and lungs: Regular rate and rhythm without rub or gallop, lungs are bilaterally clear    HEENT: Normocephalic atraumatic eyes are anicteric    Other:    Pertinant exam for procedure: Soft nontender nondistended bowel sounds positive normoactive    Planned proceedures: Colonoscopy and indicated procedures. I have discussed the risks benefits and complications of procedures to include not limited to bleeding, infection, perforation and the risk of sedation and the patient wishes to proceed.    Christena DeemMartin U Skulskie, MD Gastroenterology 08/16/2017  12:10 PM

## 2017-08-17 ENCOUNTER — Encounter: Payer: Self-pay | Admitting: Gastroenterology

## 2017-08-17 LAB — SURGICAL PATHOLOGY

## 2017-12-07 ENCOUNTER — Other Ambulatory Visit: Payer: Self-pay | Admitting: Cardiology

## 2017-12-07 DIAGNOSIS — I480 Paroxysmal atrial fibrillation: Secondary | ICD-10-CM

## 2018-01-04 ENCOUNTER — Other Ambulatory Visit: Payer: Self-pay | Admitting: Cardiology

## 2018-01-04 DIAGNOSIS — I480 Paroxysmal atrial fibrillation: Secondary | ICD-10-CM

## 2018-01-21 ENCOUNTER — Other Ambulatory Visit: Payer: Self-pay | Admitting: Cardiology

## 2018-01-21 DIAGNOSIS — I480 Paroxysmal atrial fibrillation: Secondary | ICD-10-CM

## 2018-01-21 NOTE — Telephone Encounter (Signed)
Spoke with patient as she is overdue for an appointment and multiple refills have been sent in with a note that patient needs to call and schedule but she has failed to do so. She stated that the was not at home but she would call back and schedule and appointment when she gets home. I made her aware that once the appointment is scheduled I would be able to send in just enough tablets to last her until she comes in. Patient verbalized her understanding.

## 2018-01-21 NOTE — Telephone Encounter (Signed)
Will refill once patient calls and schedules an appointment

## 2018-02-01 ENCOUNTER — Other Ambulatory Visit: Payer: Self-pay

## 2018-02-01 DIAGNOSIS — I480 Paroxysmal atrial fibrillation: Secondary | ICD-10-CM

## 2018-02-04 NOTE — Telephone Encounter (Signed)
Didn't see that refill sent in until I made note below. No more refills are allowed unless approved by Dr. Elberta Fortisamnitz or his nurse. Thanks.

## 2018-02-04 NOTE — Telephone Encounter (Signed)
Pt will need to make an appt in order to get enough pills to get her to that appt.  She cannot schedule 2-3 months/more out and get refills till then. If she would like refill she needs to come in within a month.  Thanks.  2 weeks ago pt was instructed that she would need to make an appt and we would give her only enough medication to get her to that appt..  She has not made an appt (patient last seen in 07/2016 and needs to be seen [id number] on antiarrythmic) The following is the previous note r/t this:  Isley, Mindy L, CMA 2 weeks ago      Will refill once patient calls and schedules an appointment      Documentation     Isley, Mindy L, CMA 2 weeks ago      Spoke with patient as she is overdue for an appointment and multiple refills have been sent in with a note that patient needs to call and schedule but she has failed to do so. She stated that the was not at home but she would call back and schedule and appointment when she gets home. I made her aware that once the appointment is scheduled I would be able to send in just enough tablets to last her until she comes in. Patient verbalized her understanding.      Documentation     Interface, Surescripts Out routed conversation to [facility]

## 2021-10-24 ENCOUNTER — Emergency Department: Payer: 59

## 2021-10-24 ENCOUNTER — Emergency Department
Admission: EM | Admit: 2021-10-24 | Discharge: 2021-10-24 | Disposition: A | Discharge status: Home / Self Care | Payer: 59 | Attending: Emergency Medicine | Admitting: Emergency Medicine

## 2021-10-24 ENCOUNTER — Other Ambulatory Visit: Payer: Self-pay

## 2021-10-24 ENCOUNTER — Encounter: Payer: Self-pay | Admitting: Emergency Medicine

## 2021-10-24 DIAGNOSIS — M1711 Unilateral primary osteoarthritis, right knee: Secondary | ICD-10-CM | POA: Diagnosis not present

## 2021-10-24 DIAGNOSIS — I10 Essential (primary) hypertension: Secondary | ICD-10-CM | POA: Diagnosis not present

## 2021-10-24 DIAGNOSIS — M25561 Pain in right knee: Secondary | ICD-10-CM | POA: Diagnosis present

## 2021-10-24 MED ORDER — HYDROCODONE-ACETAMINOPHEN 5-325 MG PO TABS: 1.0000 | ORAL_TABLET | Freq: Four times a day (QID) | ORAL | 0 refills | Status: DC | PRN

## 2021-10-24 NOTE — ED Notes (Signed)
Pt refused Knee immobilizer at this time

## 2021-10-24 NOTE — ED Notes (Signed)
Signature pad in room not working, pt verbalized understanding of discharge paperwork and follow up plan

## 2021-10-24 NOTE — ED Triage Notes (Signed)
Pt to ED from home c/o right knee pain.  States was walking and heard a pop while walking yesterday.  Denies falls.  States had xray done 2 weeks ago at [facility] that showed some arthritis.  Pt with limp but steady gait, no obvious deformity noted.

## 2021-10-24 NOTE — Discharge Instructions (Signed)
Follow-up with your orthopedist at [facility] as previously scheduled.  You may also send a secure message to him letting him know that he you were seen in the emergency department today so that if any any imaging that he would like to see can be done prior to your appointment.  You may also follow-up with [facility] if you are unable to see your orthopedist.  A prescription for hydrocodone was sent to the pharmacy if you needed over the weekend for pain.  Ice and elevation.  Continue using your Voltaren gel.  The anti-inflammatories are contraindicated due to your Eliquis.

## 2021-10-24 NOTE — ED Provider Notes (Signed)
[facility] Provider Note    Event Date/Time   First MD Initiated Contact with Patient 10/24/21 [phone]     (approximate)   History   Knee Pain   HPI  Amber Beasley is a 61 y.o. female   presents to the ED with complaint of right knee pain.  Patient states she was seen at [facility] 2 weeks ago for knee pain and x-ray showed arthritis.  Patient has been doing some exercises.  She states yesterday she was walking down some steps and heard a pop which has continued to cause pain on the medial aspect of her right knee.  She denies any recent injuries.  Patient has history of hypertension, MVP, PAF and right knee osteoarthritis.  She rates her pain as 6 out of 10.      Physical Exam   Triage Vital Signs: ED Triage Vitals  Enc Vitals Group     BP 10/24/21 0637 138/77     Pulse Rate 10/24/21 0637 72     Resp 10/24/21 0637 14     Temp 10/24/21 0637 98.2 F (36.8 C)     Temp Source 10/24/21 0637 Oral     SpO2 10/24/21 0637 97 %     Weight 10/24/21 0638 200 lb (90.7 kg)     Height 10/24/21 0638 5\' 5"  (1.651 m)     Head Circumference --      Peak Flow --      Pain Score 10/24/21 0638 6     Pain Loc --      Pain Edu? --      Excl. in [place]? --     Most recent vital signs: Vitals:   10/24/21 0637  BP: 138/77  Pulse: 72  Resp: 14  Temp: 98.2 F (36.8 C)  SpO2: 97%     General: Awake, no distress.  CV:  Good peripheral perfusion.  Resp:  Normal effort.  Abd:  No distention.  Other:  Right knee no gross deformity was noted.  There is mild soft tissue edema in the right knee in comparison with the left.  Patient has moderate tenderness to the medial aspect of her right knee.  No effusion is appreciated.  Minimal crepitus present with range of motion.  No instability appreciated with stress on the right knee medially and laterally.  Skin is intact.  No erythema or or discoloration is present.   ED Results / Procedures / Treatments   Labs (all labs ordered  are listed, but only abnormal results are displayed) Labs Reviewed - No data to display    RADIOLOGY Right knee x-ray images were reviewed and interpreted by me with no acute bony injury but osteoarthritis was noted.  Radiology reports Tricompartmental osteoarthritis.    PROCEDURES:  Critical Care performed:   Procedures   MEDICATIONS ORDERED IN ED: Medications - No data to display   IMPRESSION / MDM / ASSESSMENT AND PLAN / ED COURSE  I reviewed the triage vital signs and the nursing notes.   Differential diagnosis includes, but is not limited to, osteoarthritis right knee, sprain right knee, internal derangement right knee.  61 year old female presents to the ED with complaint of continued right knee pain.  Patient was seen at the [facility] approximately 2 weeks ago at which time x-rays showed osteoarthritis and patient also received an injection of steroids and has a follow-up appointment in 8 weeks.  Patient states that she was walking down steps and felt a pop and  has continued to have pain since that time.  Physical exam has tenderness on the medial aspect of the right knee but no deformity is noted.  X-ray confirms osteoarthritis.  Also no instability on stress of the right knee medially and laterally.  Patient is able to stand and ambulate without any assistance.  A knee immobilizer was ordered however patient states that she does not want this at this time and wants to follow-up with her orthopedist.  She voices that she came here for an MRI.  She is strongly encouraged to follow-up with her orthopedist and even message him on [facility] MyChart to see if he thinks that imaging is needed prior to her next visit.        FINAL CLINICAL IMPRESSION(S) / ED DIAGNOSES   Final diagnoses:  Acute pain of right knee  Osteoarthritis of right knee, unspecified osteoarthritis type     Rx / DC Orders   ED Discharge Orders          Ordered    HYDROcodone-acetaminophen (NORCO/VICODIN)  5-325 MG tablet  Every 6 hours PRN        10/24/21 0805             Note:  This document was prepared using Dragon voice recognition software and may include unintentional dictation errors.   Tommi Rumps, PA-C 10/24/21 1345    Sharman Cheek, MD 10/25/21 2324

## 2021-10-24 NOTE — ED Notes (Signed)
See triage note. Pt c/o R knee pain, states not obvious injury but states she heard a "pop" when she walking yesterday. Denies any falls. Pt does have a hx of arthritis. No obvious deformity noted and pt states it is painful to bear weight. Pt is A&Ox4 and NAD

## 2022-07-26 ENCOUNTER — Ambulatory Visit
Admission: RE | Admit: 2022-07-26 | Discharge: 2022-07-26 | Disposition: A | Discharge status: Home / Self Care | Payer: 59 | Source: Ambulatory Visit | Attending: Physician Assistant | Admitting: Physician Assistant

## 2022-07-26 VITALS — BP 130/75 | HR 73 | Temp 98.4°F | Ht 65.0 in | Wt 198.0 lb

## 2022-07-26 DIAGNOSIS — R051 Acute cough: Secondary | ICD-10-CM

## 2022-07-26 DIAGNOSIS — J040 Acute laryngitis: Secondary | ICD-10-CM

## 2022-07-26 MED ORDER — PROMETHAZINE-DM 6.25-15 MG/5ML PO SYRP: 5.0000 mL | ORAL_SOLUTION | Freq: Four times a day (QID) | ORAL | 0 refills | Status: AC | PRN

## 2022-07-26 MED ORDER — PREDNISONE 20 MG PO TABS: 40.0000 mg | ORAL_TABLET | Freq: Every day | ORAL | 0 refills | Status: AC

## 2022-07-26 NOTE — ED Triage Notes (Signed)
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

## 2022-07-26 NOTE — ED Provider Notes (Signed)
[facility]    CSN: [id number] Arrival date & time: 07/26/22  1058      History   Chief Complaint Chief Complaint  Patient presents with   Cough    HPI Amber Beasley is a 62 y.o. female presenting for 9-day history of cough that is occasionally productive of yellowish-green sputum, nasal congestion.  Reports voice hoarseness over the past few days.  Reports that she felt feverish at onset but has not had a fever since.  She denies sinus pain or pressure, sore throats, chest pain or shortness of breath.  Reports some pain in her ribs when she coughs a lot.  Has tried multiple over-the-counter cough medications without relief.  No report of any sick contacts.  No other complaints.  HPI  Past Medical History:  Diagnosis Date   Anxiety    Hypertension    MVP (mitral valve prolapse)    PAF (paroxysmal atrial fibrillation) [facility])     Patient Active Problem List   Diagnosis Date Noted   Paroxysmal atrial fibrillation ([place]) 08/04/2016   Hypertension 08/04/2016    Past Surgical History:  Procedure Laterality Date   CHOLECYSTECTOMY     COLONOSCOPY     COLONOSCOPY WITH PROPOFOL N/A 08/16/2017   Procedure: COLONOSCOPY WITH PROPOFOL;  Surgeon: Lollie Sails, MD;  Location: [facility] ENDOSCOPY;  Service: Endoscopy;  Laterality: N/A;    OB History   No obstetric history on file.      Home Medications    Prior to Admission medications   Medication Sig Start Date End Date Taking? Authorizing Provider  apixaban (ELIQUIS) 5 MG TABS tablet Take 1 tablet (5 mg total) by mouth 2 (two) times daily. 01/30/16  Yes Deboraha Sprang, MD  B Complex-C (B-COMPLEX WITH VITAMIN C) tablet Take 1 tablet by mouth daily.   Yes [provider]  benazepril-hydrochlorthiazide (LOTENSIN HCT) 20-12.5 MG tablet Take 0.5 tablets by mouth daily. 12/02/15  Yes [provider]  cholecalciferol (VITAMIN D) 1000 UNITS tablet Take 1,000 Units by mouth daily.   Yes [provider]  magnesium oxide (MAG-OX) 400 MG tablet Take 400 mg by mouth daily.   Yes [provider]  metoprolol tartrate (LOPRESSOR) 50 MG tablet Take 1 tablet (50 mg total) by mouth 2 (two) times daily. Please make overdue appt with Dr. Curt Bears. 3rd and Final Attempt 01/04/18  Yes Camnitz, Will Hassell Done, MD  predniSONE (DELTASONE) 20 MG tablet Take 2 tablets (40 mg total) by mouth daily for 5 days. 07/26/22 07/31/22 Yes Danton Clap, PA-C  promethazine-dextromethorphan (PROMETHAZINE-DM) 6.25-15 MG/5ML syrup Take 5 mLs by mouth 4 (four) times daily as needed. 07/26/22  Yes Laurene Footman B, PA-C  sertraline (ZOLOFT) 100 MG tablet Take 150 mg by mouth daily.    Yes [provider]  ALPRAZolam Duanne Moron) 0.5 MG tablet Take 0.5 mg by mouth at bedtime as needed for anxiety.    [provider]  cyclobenzaprine (FLEXERIL) 10 MG tablet Take 10 mg by mouth 3 (three) times daily as needed for muscle spasms.    [provider]  flecainide (TAMBOCOR) 50 MG tablet TAKE 1 TABLET BY MOUTH TWICE A DAY. Please make overdue appt with Dr. Curt Bears. 3rd and Final attempt 01/04/18   Constance Haw, MD    Family History Family History  Problem Relation Age of Onset   Atrial fibrillation Mother    Cancer Mother    Hypertension Mother    Lung cancer Father  Hypertension Brother    Breast cancer Maternal Grandmother    Heart attack Maternal Grandmother    Heart attack Paternal Grandmother    Hypertension Paternal Grandmother    Bipolar disorder Paternal Grandfather    Stroke Paternal Grandfather     Social History Social History   Tobacco Use   Smoking status: Never   Smokeless tobacco: Never  Vaping Use   Vaping Use: Never used  Substance Use Topics   Alcohol use: Yes    Comment: couple glasses a week   Drug use: No     Allergies   Zolpidem   Review of Systems Review of Systems  Constitutional:  Positive for fatigue. Negative for chills, diaphoresis and fever.   HENT:  Positive for congestion, rhinorrhea and voice change. Negative for ear pain, sinus pressure, sinus pain and sore throat.   Respiratory:  Positive for cough. Negative for shortness of breath and wheezing.   Cardiovascular:  Negative for chest pain.  Gastrointestinal:  Negative for abdominal pain, nausea and vomiting.  Musculoskeletal:  Negative for arthralgias and myalgias.  Skin:  Negative for rash.  Neurological:  Negative for weakness and headaches.     Physical Exam Triage Vital Signs ED Triage Vitals  Enc Vitals Group     BP      Pulse      Resp      Temp      Temp src      SpO2      Weight      Height      Head Circumference      Peak Flow      Pain Score      Pain Loc      Pain Edu?      Excl. in [place]?    No data found.  Updated Vital Signs BP 130/75 (BP Location: Left Arm)   Pulse 73   Temp 98.4 F (36.9 C) (Oral)   Ht 5' 5"$  (1.651 m)   Wt 198 lb (89.8 kg)   SpO2 99%   BMI 32.95 kg/m     Physical Exam Vitals and nursing note reviewed.  Constitutional:      General: She is not in acute distress.    Appearance: Normal appearance. She is ill-appearing. She is not toxic-appearing.     Comments: +voice hoarseness  HENT:     Head: Normocephalic and atraumatic.     Nose: Congestion present.     Mouth/Throat:     Mouth: Mucous membranes are moist.     Pharynx: Oropharynx is clear. No posterior oropharyngeal erythema.  Eyes:     General: No scleral icterus.       Right eye: No discharge.        Left eye: No discharge.     Conjunctiva/sclera: Conjunctivae normal.  Cardiovascular:     Rate and Rhythm: Normal rate and regular rhythm.     Heart sounds: Normal heart sounds.  Pulmonary:     Effort: Pulmonary effort is normal. No respiratory distress.     Breath sounds: Normal breath sounds. No wheezing, rhonchi or rales.  Musculoskeletal:     Cervical back: Neck supple.  Skin:    General: Skin is dry.  Neurological:     General: No focal deficit  present.     Mental Status: She is alert. Mental status is at baseline.     Motor: No weakness.     Gait: Gait normal.  Psychiatric:  Mood and Affect: Mood normal.        Behavior: Behavior normal.        Thought Content: Thought content normal.      UC Treatments / Results  Labs (all labs ordered are listed, but only abnormal results are displayed) Labs Reviewed - No data to display  EKG   Radiology No results found.  Procedures Procedures (including critical care time)  Medications Ordered in UC Medications - No data to display  Initial Impression / Assessment and Plan / UC Course  I have reviewed the triage vital signs and the nursing notes.  Pertinent labs & imaging results that were available during my care of the patient were reviewed by me and considered in my medical decision making (see chart for details).   62 year old female presents for 9-day history of cough, congestion, fatigue.  Also reports/notes over the past few days.  No recent fevers, sinus pain, ear pain, sore throat, chest pain or shortness of breath.  Vitals normal and stable.  She is ill-appearing but nontoxic.  She is hoarse.  Mild nasal congestion.  Throat clear.  Chest clear to auscultation.  Viral laryngitis.  Will treat patient at this time with prednisone and also Promethazine DM.  Encouraged rest and fluids.  Vies her to return if not improving over the next week or if she develops a fever, weakness or shortness of breath.   Final Clinical Impressions(s) / UC Diagnoses   Final diagnoses:  Acute laryngitis  Acute cough     Discharge Instructions      URI/COLD SYMPTOMS: Your exam today is consistent with a viral illness. Antibiotics are not indicated at this time. Use medications as directed, including cough syrup, nasal saline, and decongestants. Your symptoms should improve over the next few days and resolve within 7-10 days. Increase rest and fluids. F/u if symptoms worsen or  predominate such as sore throat, ear pain, productive cough, shortness of breath, or if you develop high fevers or worsening fatigue over the next several days.    -Return if fever, weakness, shortness of breath     ED Prescriptions     Medication Sig Dispense Auth. Provider   promethazine-dextromethorphan (PROMETHAZINE-DM) 6.25-15 MG/5ML syrup Take 5 mLs by mouth 4 (four) times daily as needed. 118 mL Laurene Footman B, PA-C   predniSONE (DELTASONE) 20 MG tablet Take 2 tablets (40 mg total) by mouth daily for 5 days. 10 tablet Gretta Cool      PDMP not reviewed this encounter.   Danton Clap, PA-C 07/26/22 1132

## 2022-07-26 NOTE — Discharge Instructions (Addendum)
URI/COLD SYMPTOMS: Your exam today is consistent with a viral illness. Antibiotics are not indicated at this time. Use medications as directed, including cough syrup, nasal saline, and decongestants. Your symptoms should improve over the next few days and resolve within 7-10 days. Increase rest and fluids. F/u if symptoms worsen or predominate such as sore throat, ear pain, productive cough, shortness of breath, or if you develop high fevers or worsening fatigue over the next several days.    -Return if fever, weakness, shortness of breath

## 2022-09-18 ENCOUNTER — Other Ambulatory Visit: Payer: Self-pay

## 2022-09-18 DIAGNOSIS — Z8249 Family history of ischemic heart disease and other diseases of the circulatory system: Secondary | ICD-10-CM

## 2022-09-18 DIAGNOSIS — Z0000 Encounter for general adult medical examination without abnormal findings: Secondary | ICD-10-CM

## 2022-09-18 DIAGNOSIS — E782 Mixed hyperlipidemia: Secondary | ICD-10-CM

## 2022-10-06 ENCOUNTER — Inpatient Hospital Stay: Admission: RE | Admit: 2022-10-06 | Payer: 59 | Source: Ambulatory Visit

## 2023-06-08 IMAGING — DX DG KNEE COMPLETE 4+V*R*
4 series · 4 of 4 positions shown · non-contrast
Comparison: No priors.

CLINICAL DATA: 60-year-old female with history of acute onset of
right-sided knee pain.

EXAM:
RIGHT KNEE - COMPLETE 4+ VIEW

[knee ap]
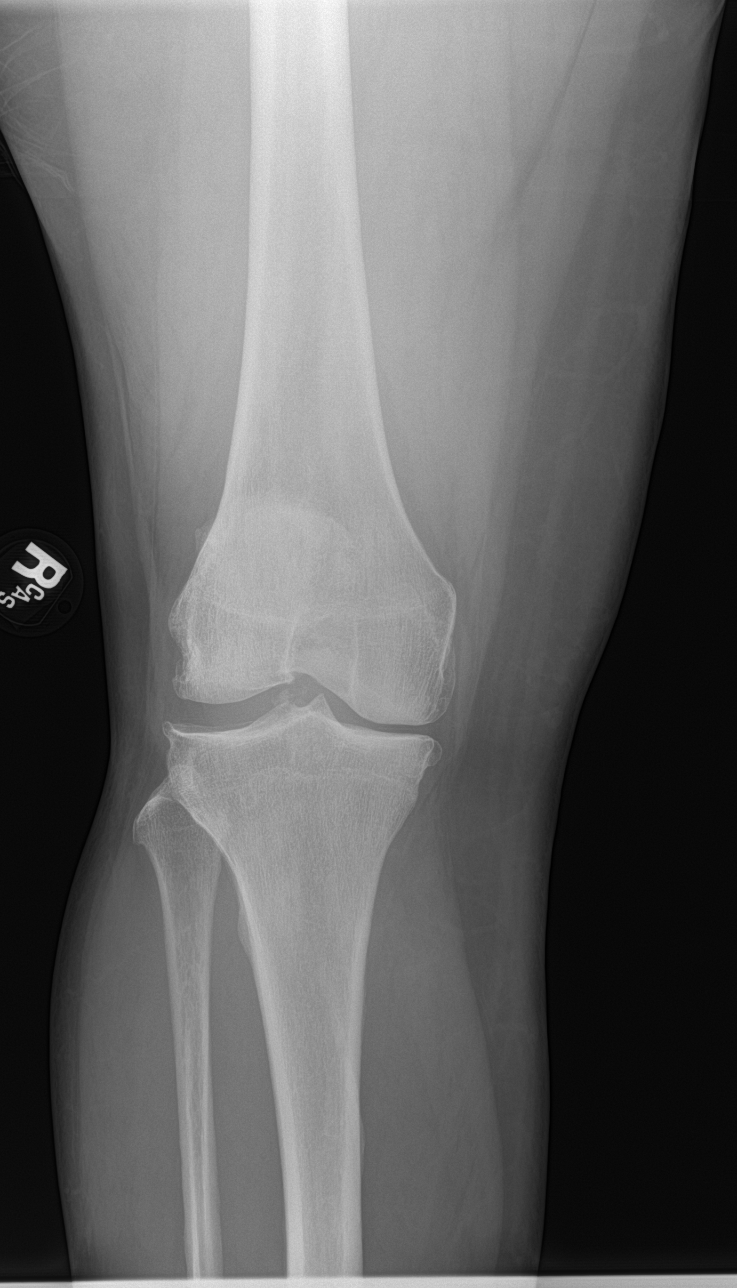

[knee tunnel]
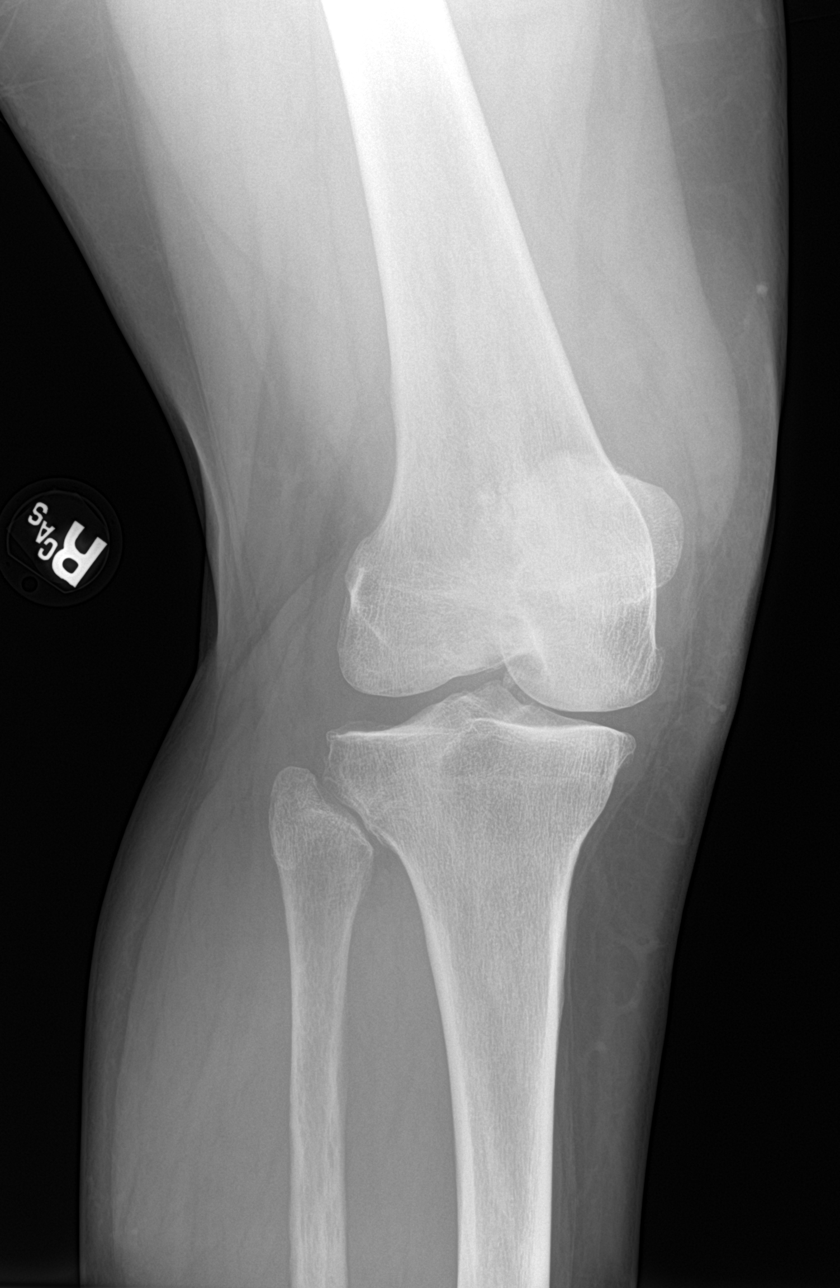

[knee lat]
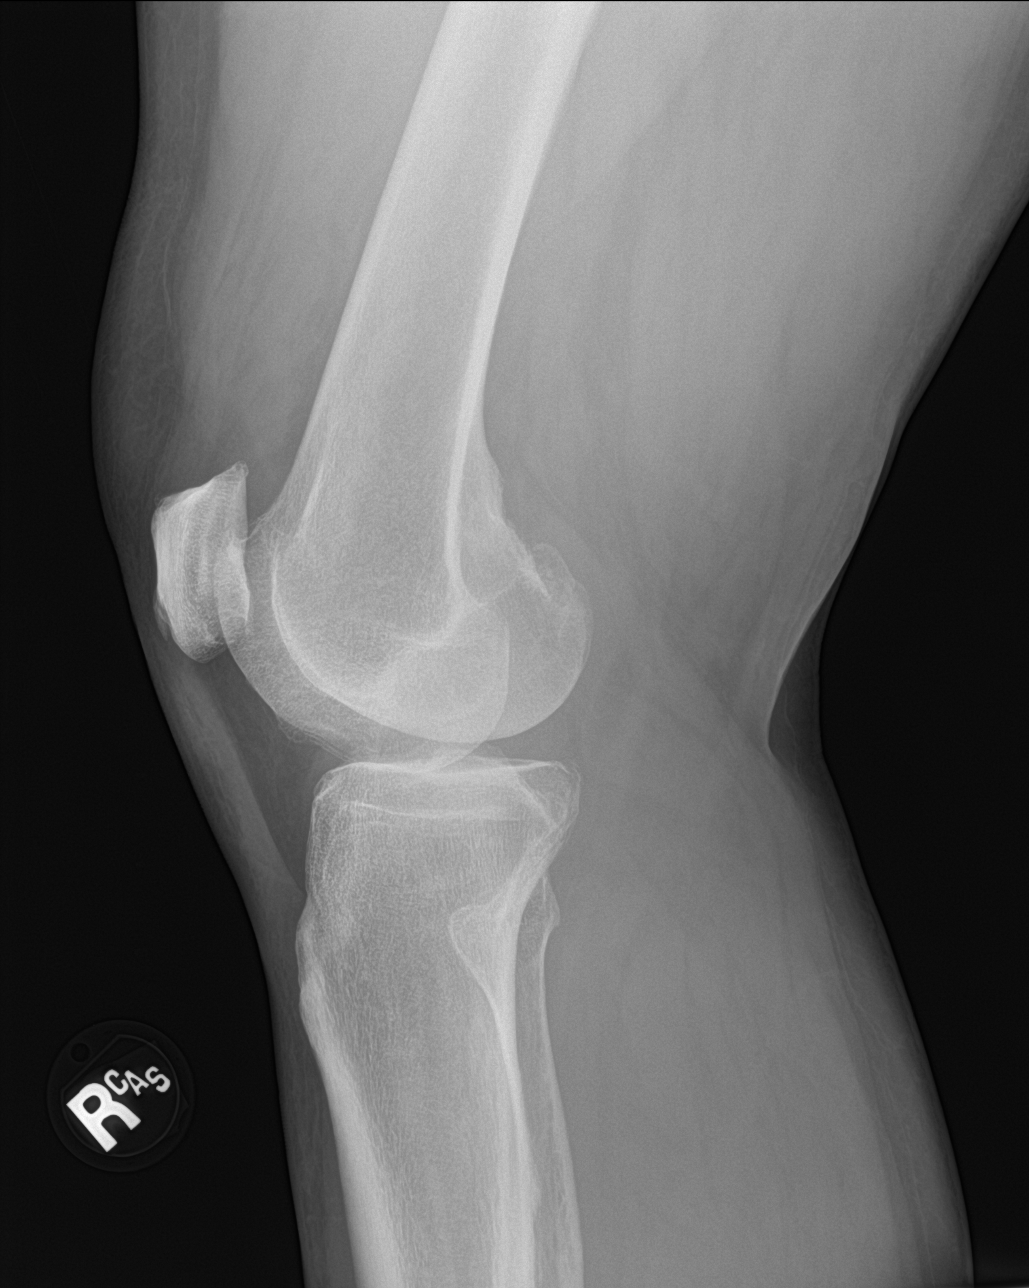

[knee obl]
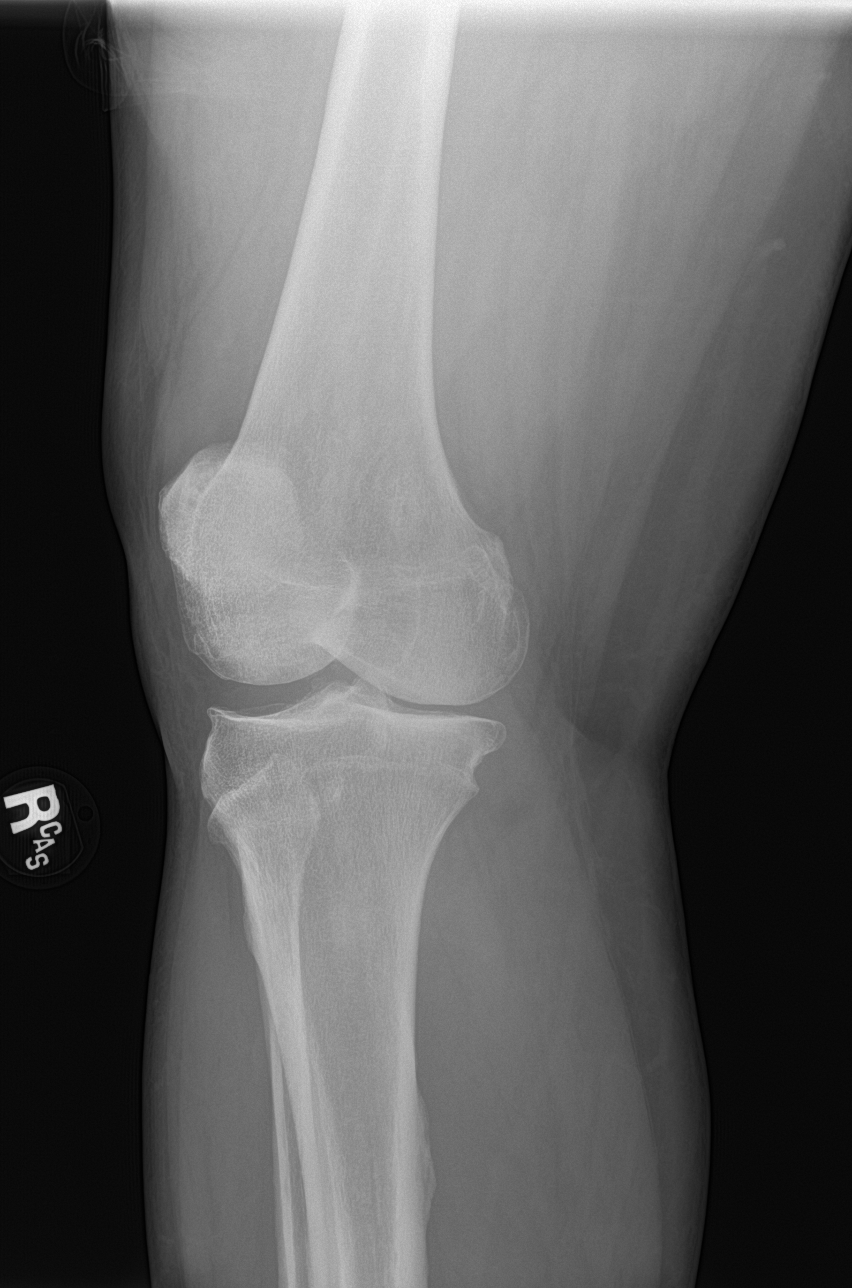

[4 of 4 positions shown; findings below may reference images not displayed]

FINDINGS: Four views of the right knee demonstrate no acute displaced
fracture, subluxation or dislocation. There is joint space
narrowing, subchondral sclerosis, subchondral cyst formation and
osteophyte formation in a tricompartmental distribution, most severe
in the medial and patellofemoral compartments, indicative of
osteoarthritis.
IMPRESSION: 1. No acute radiographic abnormality of the right knee.
2. Tricompartmental osteoarthritis, most severe in the medial and
patellofemoral compartments.

## 2023-09-23 ENCOUNTER — Other Ambulatory Visit: Payer: Self-pay | Admitting: Internal Medicine

## 2023-09-23 DIAGNOSIS — E782 Mixed hyperlipidemia: Secondary | ICD-10-CM

## 2023-09-23 DIAGNOSIS — Z0000 Encounter for general adult medical examination without abnormal findings: Secondary | ICD-10-CM
